# Patient Record
Sex: Female | Born: 2000 | Hispanic: No | Marital: Single | Smoking: Never smoker
Health system: Southern US, Community
[De-identification: ages and names within clinical notes are randomized; demographics above are authoritative.]

---
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
## 2021-06-30 ENCOUNTER — Ambulatory Visit: Payer: Self-pay | Admitting: Orthopedic Surgery
